# Patient Record
Sex: Female | Born: 1970 | Race: White | Hispanic: No
Health system: Southern US, Community
[De-identification: ages and names within clinical notes are randomized; demographics above are authoritative.]

## PROBLEM LIST (undated history)

## (undated) DIAGNOSIS — E039 Hypothyroidism, unspecified: Secondary | ICD-10-CM

## (undated) DIAGNOSIS — I10 Essential (primary) hypertension: Secondary | ICD-10-CM

## (undated) HISTORY — PX: TUBAL LIGATION: SHX77

## (undated) HISTORY — DX: Hypothyroidism, unspecified: E03.9

## (undated) HISTORY — DX: Essential (primary) hypertension: I10

---

## 2019-11-18 ENCOUNTER — Ambulatory Visit: Payer: Self-pay | Attending: Internal Medicine

## 2019-11-18 DIAGNOSIS — Z23 Encounter for immunization: Secondary | ICD-10-CM | POA: Insufficient documentation

## 2019-11-18 NOTE — Progress Notes (Signed)
   Covid-19 Vaccination Clinic  Name:  Carolyn Browning    MRN: [id number] DOB: 1971/03/02  11/18/2019  [name] was observed post Covid-19 immunization for 15 minutes without incident. She was provided with Vaccine Information Sheet and instruction to access the V-Safe system.   [name] was instructed to call 911 with any severe reactions post vaccine: Marland Kitchen Difficulty breathing  . Swelling of face and throat  . A fast heartbeat  . A bad rash all over body  . Dizziness and weakness   Immunizations Administered    Name Date Dose VIS Date Route   Pfizer COVID-19 Vaccine 11/18/2019  3:29 PM 0.3 mL 08/24/2019 Intramuscular   Manufacturer: [employer], [employer]   Lot: [id number]   NDC: [address]-2

## 2019-12-09 ENCOUNTER — Ambulatory Visit: Payer: Self-pay | Attending: Internal Medicine

## 2019-12-09 ENCOUNTER — Other Ambulatory Visit: Payer: Self-pay

## 2019-12-09 DIAGNOSIS — Z23 Encounter for immunization: Secondary | ICD-10-CM

## 2019-12-09 NOTE — Progress Notes (Signed)
   Covid-19 Vaccination Clinic  Name:  Carolyn Browning    MRN: [id number] DOB: 1970-10-18  12/09/2019  [name] was observed post Covid-19 immunization for 15 minutes without incident. She was provided with Vaccine Information Sheet and instruction to access the V-Safe system.   [name] was instructed to call 911 with any severe reactions post vaccine: Marland Kitchen Difficulty breathing  . Swelling of face and throat  . A fast heartbeat  . A bad rash all over body  . Dizziness and weakness   Immunizations Administered    Name Date Dose VIS Date Route   Pfizer COVID-19 Vaccine 12/09/2019  1:52 PM 0.3 mL 08/24/2019 Intramuscular   Manufacturer: [employer], [employer]   Lot: [id number]   NDC: [address]-8

## 2021-09-30 ENCOUNTER — Other Ambulatory Visit: Payer: Self-pay | Admitting: Internal Medicine

## 2021-09-30 DIAGNOSIS — Z1231 Encounter for screening mammogram for malignant neoplasm of breast: Secondary | ICD-10-CM

## 2021-10-21 ENCOUNTER — Ambulatory Visit
Admission: RE | Admit: 2021-10-21 | Discharge: 2021-10-21 | Disposition: A | Discharge status: Home / Self Care | Payer: BC Managed Care – PPO | Source: Ambulatory Visit | Attending: Internal Medicine | Admitting: Internal Medicine

## 2021-10-21 DIAGNOSIS — Z1231 Encounter for screening mammogram for malignant neoplasm of breast: Secondary | ICD-10-CM

## 2022-12-07 ENCOUNTER — Other Ambulatory Visit: Payer: Self-pay | Admitting: Family Medicine

## 2022-12-07 DIAGNOSIS — Z1231 Encounter for screening mammogram for malignant neoplasm of breast: Secondary | ICD-10-CM

## 2022-12-21 ENCOUNTER — Ambulatory Visit
Admission: RE | Admit: 2022-12-21 | Discharge: 2022-12-21 | Disposition: A | Discharge status: Home / Self Care | Payer: BC Managed Care – PPO | Source: Ambulatory Visit | Attending: Family Medicine | Admitting: Family Medicine

## 2022-12-21 DIAGNOSIS — Z1231 Encounter for screening mammogram for malignant neoplasm of breast: Secondary | ICD-10-CM

## 2023-08-24 ENCOUNTER — Encounter: Payer: Self-pay | Admitting: Neurology

## 2023-09-17 IMAGING — MG MM DIGITAL SCREENING BILAT W/ TOMO AND CAD
6 of 10 series · 6 of 30 positions shown · non-contrast
Comparison: Previous exam(s).

CLINICAL DATA: Screening.

EXAM:
DIGITAL SCREENING BILATERAL MAMMOGRAM WITH TOMOSYNTHESIS AND CAD
TECHNIQUE: Bilateral screening digital craniocaudal and mediolateral oblique
mammograms were obtained. Bilateral screening digital breast
tomosynthesis was performed. The images were evaluated with
computer-aided detection.

[R CC synth-2D]
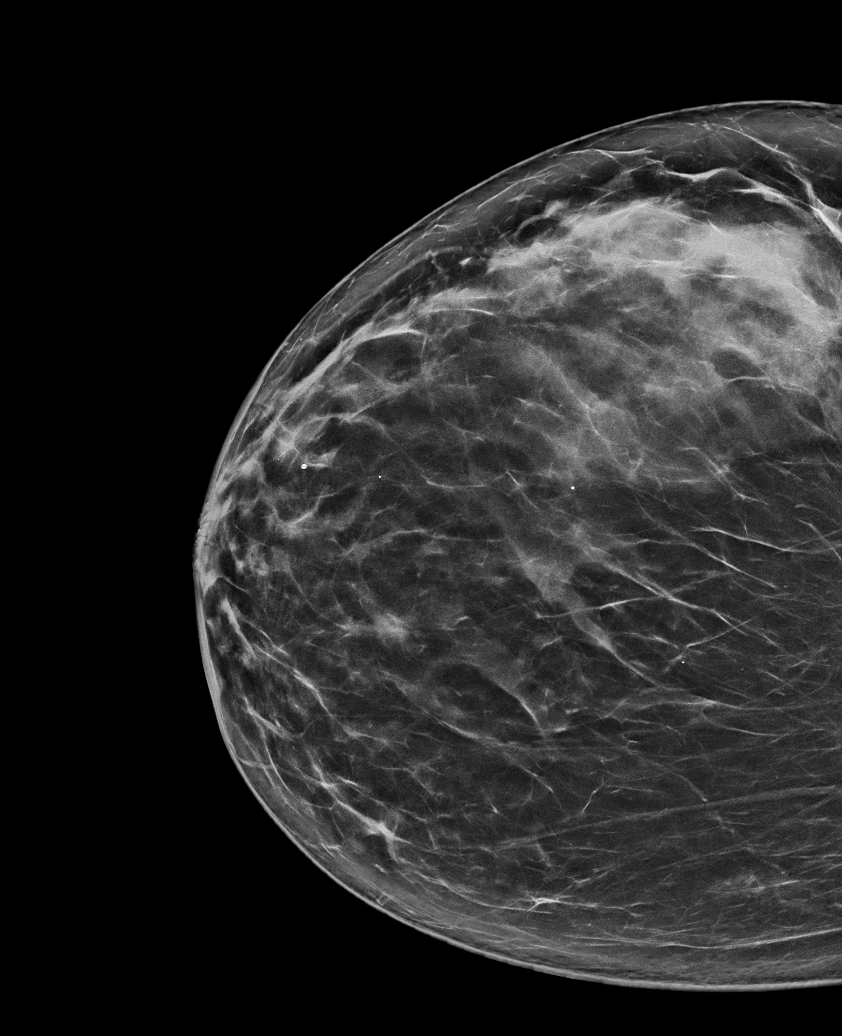

[R MLO synth-2D]
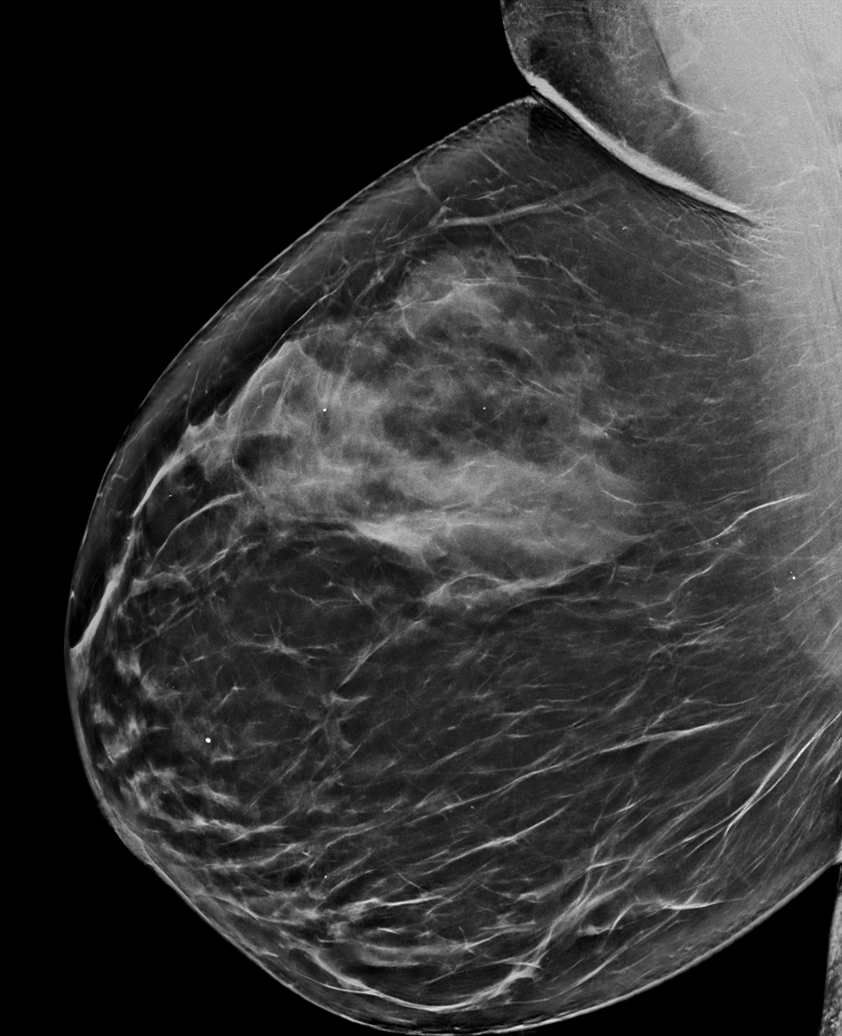

[L CC synth-2D]
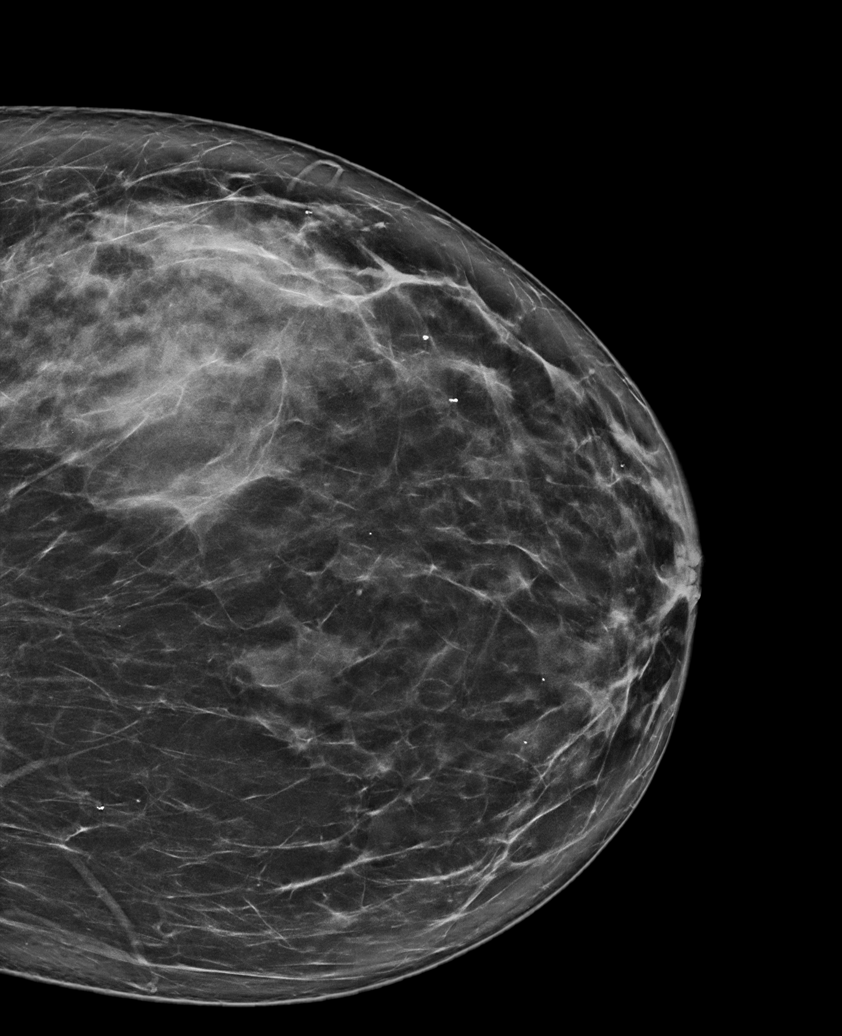

[L MLO synth-2D (1 of 2)]
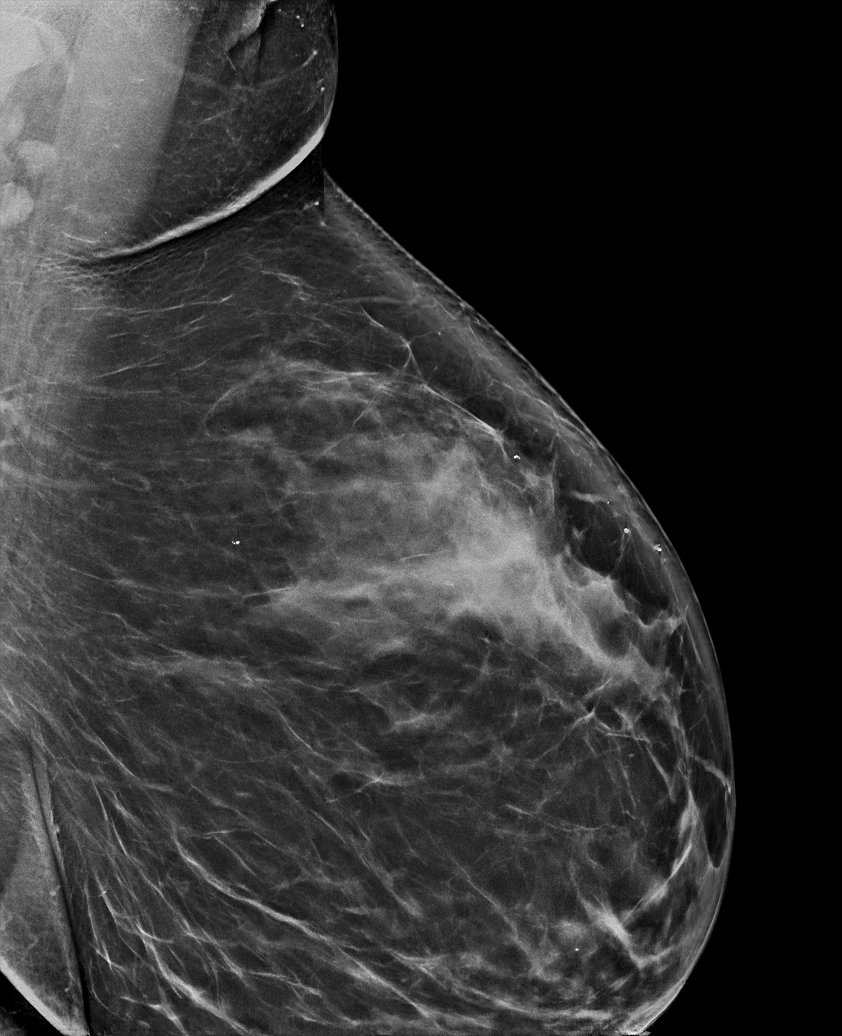

[L MLO synth-2D (2 of 2)]
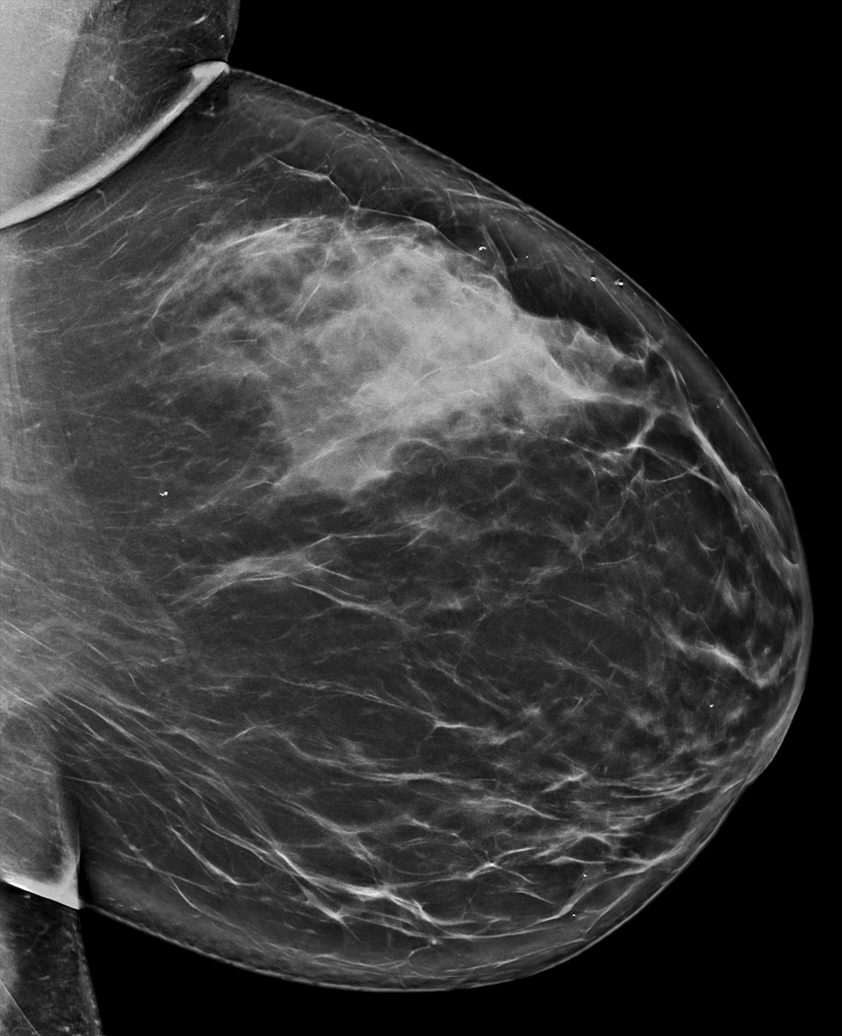

[L MLO tomo · tomo slice 55/108.0]
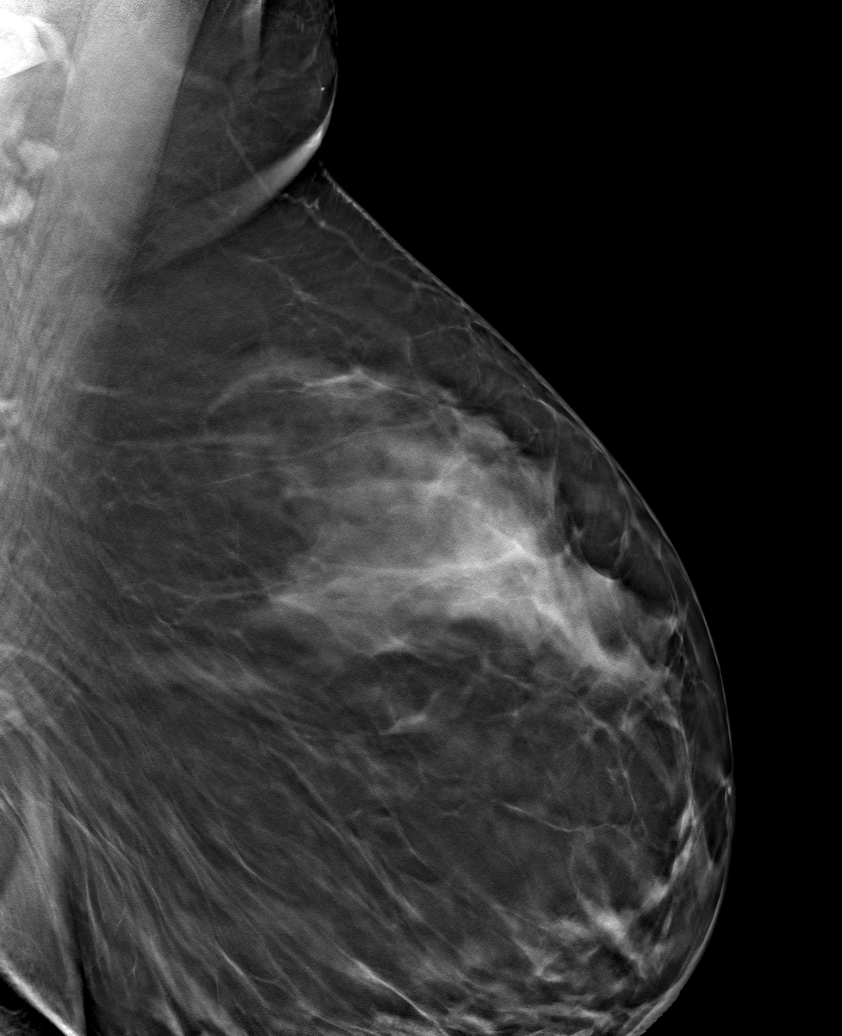

[6 of 30 positions shown; findings below may reference images not displayed]

ACR Breast Density Category b: There are scattered areas of
fibroglandular density.
FINDINGS: There are no findings suspicious for malignancy.
IMPRESSION: No mammographic evidence of malignancy. A result letter of this
screening mammogram will be mailed directly to the patient.

RECOMMENDATION:
Screening mammogram in one year. (Code:51-O-LD2)

BI-RADS CATEGORY  1: Negative.

## 2023-10-07 NOTE — Progress Notes (Unsigned)
[facility] Clinic Note - Initial Visit   Date: 10/10/2023   Carolyn Browning MRN: [id number] DOB: [birth year]   Dear Dr. Sherryll Burger:  Thank you for your kind referral of Carolyn Browning for consultation of left hand paresthesias. Although her history is well known to you, please allow [place] to reiterate it for the purpose of our medical record. The patient was accompanied to the clinic by self.    Carolyn Browning is a 53 y.o. right-handed female with hypertension and hypothyroidism presenting for evaluation of bilateral hand paresthesias.   IMPRESSION/PLAN: Bilateral hand parethesias, worse on the left.  Possible carpal tunnel syndrome.   - NCS/EMG of the arms  - Continue to use wrist braces at night  Further recommendations pending results.  ------------------------------------------------------------- History of present illness: Starting around early 2024, she began having numbness/tingling of the hands, worse on the left.  It occurs intermittently throughout the day.  She denies neck pain or radicular pain.  She denies hand weakness.  She was recommended to use a wrist brace which she has some relief with.  She admits to not using it nightly.  Symptoms have intensified over the past year.  Her son, granddaughter, and son's girlfriend moved into her home during the summer and she has been much more active in taking care of the granddaughter.   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    Past Medical History:  Diagnosis Date   Hypertension    Hypothyroidism     History reviewed. No pertinent surgical history.   Medications:  Outpatient Encounter Medications as of 10/10/2023  Medication Sig   atenolol (TENORMIN) 100 MG tablet Take 100 mg by mouth daily.   ferrous gluconate (FERGON) 324 MG tablet Take 324 mg by mouth daily.   levothyroxine (SYNTHROID) 200 MCG tablet Take 200 mcg by mouth daily.   No facility-administered encounter  medications on file as of 10/10/2023.    Allergies: No Known Allergies  Family History: Family History  Problem Relation Age of Onset   Diabetes Mother    Hypertension Mother    Heart attack Mother    Stroke Mother    Heart Problems Mother        Defibulator   Stroke Father    Breast cancer Neg Hx     Social History: Social History   Tobacco Use   Smoking status: Never   Smokeless tobacco: Never  Substance Use Topics   Alcohol use: Not Currently   Drug use: Never   Social History   Social History Narrative   Are you right handed or left handed? Right Handed    Are you currently employed ? 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 No    Who lives with you? Son    What type of home do you live in: 1 story or 2 story? Lives in a two story home         Vital Signs:  BP (!) 141/78   Pulse 60   Ht 5\' 5"  (1.651 m)   Wt 208 lb (94.3 kg)   SpO2 95%   BMI 34.61 kg/m   Neurological Exam: MENTAL STATUS including orientation to time, place, person, recent and remote memory, attention span and concentration, language, and fund of knowledge is normal.  Speech is not dysarthric.  CRANIAL NERVES: II:  No visual field defects.  III-IV-VI: Pupils equal round and reactive to light.  Normal conjugate, extra-ocular eye movements in all directions of gaze.  No nystagmus.  No ptosis.   V:  Normal facial sensation.    VII:  Normal facial symmetry and movements.   VIII:  Normal hearing and vestibular function.   IX-X:  Normal palatal movement.   XI:  Normal shoulder shrug and head rotation.   XII:  Normal tongue strength and range of motion, no deviation or fasciculation.  MOTOR:  Motor strength 5/5 throughout, including bilateral ABP.  No atrophy, fasciculations or abnormal movements.  No pronator drift.  MSRs:                                           Right        Left brachioradialis 2+  2+  biceps 2+  2+  triceps 2+  2+  patellar 2+   2+  ankle jerk 2+  2+  Hoffman no  no  plantar response down  down   SENSORY:  Normal and symmetric perception of light touch, pinprick, vibration, and temperature.  Tinel's sign is absent at the wrists bilaterally.   COORDINATION/GAIT: Normal finger-to- nose-finger.  Intact rapid alternating movements bilaterally.  Able to rise from a chair without using arms.  Gait narrow based and stable. Tandem and stressed gait intact.      Thank you for allowing me to participate in patient's care.  If I can answer any additional questions, I would be pleased to do so.    Sincerely,    Dylin Breeden K. Allena Katz, DO

## 2023-10-10 ENCOUNTER — Ambulatory Visit (INDEPENDENT_AMBULATORY_CARE_PROVIDER_SITE_OTHER): Payer: 59 | Admitting: Neurology

## 2023-10-10 ENCOUNTER — Encounter: Payer: Self-pay | Admitting: Neurology

## 2023-10-10 VITALS — BP 141/78 | HR 60 | Ht 65.0 in | Wt 208.0 lb

## 2023-10-10 DIAGNOSIS — R202 Paresthesia of skin: Secondary | ICD-10-CM

## 2023-10-10 NOTE — Patient Instructions (Signed)
Nerve testing of both arms  ELECTROMYOGRAM AND NERVE CONDUCTION STUDIES (EMG/NCS) INSTRUCTIONS  How to Prepare The neurologist conducting the EMG will need to know if you have certain medical conditions. Tell the neurologist and other EMG lab personnel if you: Have a pacemaker or any other electrical medical device Take blood-thinning medications Have hemophilia, a blood-clotting disorder that causes prolonged bleeding Bathing Take a shower or bath shortly before your exam in order to remove oils from your skin. Don't apply lotions or creams before the exam.  What to Expect You'll likely be asked to change into a hospital gown for the procedure and lie down on an examination table. The following explanations can help you understand what will happen during the exam.  Electrodes. The neurologist or a technician places surface electrodes at various locations on your skin depending on where you're experiencing symptoms. Or the neurologist may insert needle electrodes at different sites depending on your symptoms.  Sensations. The electrodes will at times transmit a tiny electrical current that you may feel as a twinge or spasm. The needle electrode may cause discomfort or pain that usually ends shortly after the needle is removed. If you are concerned about discomfort or pain, you may want to talk to the neurologist about taking a short break during the exam.  Instructions. During the needle EMG, the neurologist will assess whether there is any spontaneous electrical activity when the muscle is at rest - activity that isn't present in healthy muscle tissue - and the degree of activity when you slightly contract the muscle.  He or she will give you instructions on resting and contracting a muscle at appropriate times. Depending on what muscles and nerves the neurologist is examining, he or she may ask you to change positions during the exam.  After your EMG You may experience some temporary, minor  bruising where the needle electrode was inserted into your muscle. This bruising should fade within several days. If it persists, contact your primary care doctor.

## 2023-11-07 ENCOUNTER — Other Ambulatory Visit: Payer: Self-pay | Admitting: Internal Medicine

## 2023-11-07 DIAGNOSIS — Z1231 Encounter for screening mammogram for malignant neoplasm of breast: Secondary | ICD-10-CM

## 2023-11-10 ENCOUNTER — Ambulatory Visit (INDEPENDENT_AMBULATORY_CARE_PROVIDER_SITE_OTHER): Payer: BC Managed Care – PPO | Admitting: Neurology

## 2023-11-10 DIAGNOSIS — R202 Paresthesia of skin: Secondary | ICD-10-CM

## 2023-11-10 DIAGNOSIS — G5603 Carpal tunnel syndrome, bilateral upper limbs: Secondary | ICD-10-CM

## 2023-11-10 NOTE — Progress Notes (Signed)
    Follow-up Visit   Date: 11/10/2023    [name] MRN: [id number] DOB: Oct 22, 1970    Carolyn Browning is a 53 y.o. right-handed Caucasian female with hypertension and hypothyroidism returning to the clinic for EDX of the hands.  The patient was accompanied to the clinic by self.  IMPRESSION/PLAN: Bilateral carpal tunnel syndrome, moderate, and worse on the left.  Management options discussed including wrist braces, steroid injection, or surgery.  She would like to continue conservative therapies with using wrist braces and will contact my office if symptoms progress.  --------------------------------------------- History of present illness: Starting around early 2024, she began having numbness/tingling of the hands, worse on the left.  It occurs intermittently throughout the day.  She denies neck pain or radicular pain.  She denies hand weakness.  She was recommended to use a wrist brace which she has some relief with.  She admits to not using it nightly.  Symptoms have intensified over the past year.  Her son, granddaughter, and son's girlfriend moved into her home during the summer and she has been much more active in taking care of the granddaughter.    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   UPDATE 11/10/2023:  She is here for EDX of the hands.  Symptoms are unchanged.   Medications:  Current Outpatient Medications on File Prior to Visit  Medication Sig Dispense Refill   atenolol (TENORMIN) 100 MG tablet Take 100 mg by mouth daily.     ferrous gluconate (FERGON) 324 MG tablet Take 324 mg by mouth daily.     levothyroxine (SYNTHROID) 200 MCG tablet Take 200 mcg by mouth daily.     No current facility-administered medications on file prior to visit.    Allergies: No Known Allergies  Vital Signs:  There were no vitals taken for this visit.   Neurological Exam: MENTAL STATUS including orientation to time, place, person, recent and remote  memory, attention span and concentration, language, and fund of knowledge is normal.  Speech is not dysarthric.  CRANIAL NERVES:  .  Face is symmetric.   MOTOR:  Motor strength is 5/5 in the upper extremities.  COORDINATION/GAIT:   Gait narrow based and stable.   Data: NCS/EMG of the arms 11/10/2023: Bilateral median neuropathy at or distal to the wrist, consistent with a clinical diagnosis of carpal tunnel syndrome.  Overall, these findings are moderate in degree electrically and worse on the left.   Thank you for allowing me to participate in patient's care.  If I can answer any additional questions, I would be pleased to do so.    Sincerely,    Tyree Fluharty K. Allena Katz, DO

## 2023-11-10 NOTE — Procedures (Signed)
 [facility]  [address] [place], [address]  [place], [place] [address] Tel: [phone] Fax: [phone] Test Date:  11/10/2023  Patient: Carolyn Browning DOB: 06-05-1971 Physician: Nita Sickle, DO  Sex: Female Height: 5\' 5"  Ref Phys: Nita Sickle, DO  ID#: [id number]   Technician:    History: This is a 53 year old female referred for evaluation of bilateral hand paresthesias.  NCV & EMG Findings: Extensive electrodiagnostic testing of the right upper extremity and additional studies of the left shows:  Bilateral median sensory responses show prolonged latency (R6.0, L6.0 ms) and reduced amplitude (R8.2, L4.9 V).  Bilateral ulnar sensory responses are within normal limits. Bilateral median motor responses show prolonged latency (R5.2, L5.5 ms).  Bilateral ulnar motor responses are within normal limits.  There is no evidence of active or chronic motor axonal loss changes affecting any of the tested muscles.  Motor unit configuration and recruitment pattern is within normal limits.    Impression: Bilateral median neuropathy at or distal to the wrist, consistent with a clinical diagnosis of carpal tunnel syndrome.  Overall, these findings are moderate in degree electrically and worse on the left.   ___________________________ Nita Sickle, DO    Nerve Conduction Studies   Stim Site NR Peak (ms) Norm Peak (ms) O-P Amp (V) Norm O-P Amp  Left Median Anti Sensory (2nd Digit)  32 C  Wrist    *6.0 <3.6 *4.9 >15  Right Median Anti Sensory (2nd Digit)  32 C  Wrist    *6.0 <3.6 *8.2 >15  Left Ulnar Anti Sensory (5th Digit)  32 C  Wrist    2.3 <3.1 23.9 >10  Right Ulnar Anti Sensory (5th Digit)  32 C  Wrist    2.5 <3.1 29.0 >10     Stim Site NR Onset (ms) Norm Onset (ms) O-P Amp (mV) Norm O-P Amp Site1 Site2 Delta-0 (ms) Dist (cm) Vel (m/s) Norm Vel (m/s)  Left Median Motor (Abd Poll Brev)  32 C  Wrist    *5.5 <4.0 8.2 >6 Elbow Wrist 5.0 28.0 56 >50  Elbow    10.5  7.7          Right Median Motor (Abd Poll Brev)  32 C  Wrist    *5.2 <4.0 10.3 >6 Elbow Wrist 5.3 29.0 55 >50  Elbow    10.5  9.8         Left Ulnar Motor (Abd Dig Minimi)  32 C  Wrist    2.3 <3.1 11.3 >7 B Elbow Wrist 2.9 20.0 69 >50  B Elbow    5.2  11.3  A Elbow B Elbow 1.6 10.0 63 >50  A Elbow    6.8  11.1         Right Ulnar Motor (Abd Dig Minimi)  32 C  Wrist    2.1 <3.1 11.6 >7 B Elbow Wrist 3.4 22.0 65 >50  B Elbow    5.5  11.6  A Elbow B Elbow 1.5 10.0 67 >50  A Elbow    7.0  11.4          Electromyography   Side Muscle Ins.Act Fibs Fasc Recrt Amp Dur Poly Activation Comment  Right 1stDorInt Nml Nml Nml Nml Nml Nml Nml Nml N/A  Right Abd Poll Brev Nml Nml Nml Nml Nml Nml Nml Nml N/A  Right PronatorTeres Nml Nml Nml Nml Nml Nml Nml Nml N/A  Right Biceps Nml Nml Nml Nml Nml Nml Nml Nml N/A  Right Triceps  Nml Nml Nml Nml Nml Nml Nml Nml N/A  Right Deltoid Nml Nml Nml Nml Nml Nml Nml Nml N/A  Left 1stDorInt Nml Nml Nml Nml Nml Nml Nml Nml N/A  Left Abd Poll Brev Nml Nml Nml Nml Nml Nml Nml Nml N/A  Left PronatorTeres Nml Nml Nml Nml Nml Nml Nml Nml N/A  Left Biceps Nml Nml Nml Nml Nml Nml Nml Nml N/A  Left Triceps Nml Nml Nml Nml Nml Nml Nml Nml N/A  Left Deltoid Nml Nml Nml Nml Nml Nml Nml Nml N/A      Waveforms:

## 2024-01-17 ENCOUNTER — Ambulatory Visit
Admission: RE | Admit: 2024-01-17 | Discharge: 2024-01-17 | Disposition: A | Discharge status: Home / Self Care | Payer: BC Managed Care – PPO | Source: Ambulatory Visit | Attending: Internal Medicine | Admitting: Internal Medicine

## 2024-01-17 DIAGNOSIS — Z1231 Encounter for screening mammogram for malignant neoplasm of breast: Secondary | ICD-10-CM
